# Patient Record
Sex: Female | Born: 1970 | Race: Black or African American | Hispanic: No
Health system: Southern US, Community
[De-identification: ages and names within clinical notes are randomized; demographics above are authoritative.]

## PROBLEM LIST (undated history)

## (undated) DIAGNOSIS — I10 Essential (primary) hypertension: Secondary | ICD-10-CM

---

## 2017-04-27 ENCOUNTER — Emergency Department (HOSPITAL_COMMUNITY): Payer: Self-pay

## 2017-04-27 ENCOUNTER — Emergency Department (HOSPITAL_COMMUNITY)
Admission: EM | Admit: 2017-04-27 | Discharge: 2017-04-27 | Disposition: A | Discharge status: Home / Self Care | Payer: Self-pay | Attending: Emergency Medicine | Admitting: Emergency Medicine

## 2017-04-27 ENCOUNTER — Encounter (HOSPITAL_COMMUNITY): Payer: Self-pay | Admitting: Vascular Surgery

## 2017-04-27 DIAGNOSIS — I10 Essential (primary) hypertension: Secondary | ICD-10-CM | POA: Insufficient documentation

## 2017-04-27 DIAGNOSIS — M79672 Pain in left foot: Secondary | ICD-10-CM | POA: Insufficient documentation

## 2017-04-27 HISTORY — DX: Essential (primary) hypertension: I10

## 2017-04-27 MED ORDER — DICLOFENAC SODIUM 50 MG PO TBEC: 50.0000 mg | DELAYED_RELEASE_TABLET | Freq: Two times a day (BID) | ORAL | 0 refills | Status: AC

## 2017-04-27 NOTE — ED Provider Notes (Signed)
[facility]-EMERGENCY DEPT Provider Note   CSN: [id number] Arrival date & time: 04/27/17  1710  By signing my name below, I, Orpah Cobb, attest that this documentation has been prepared under the direction and in the presence of [facility], NP-C. Electronically Signed: Orpah Cobb , ED Scribe. 04/27/17. 6:50 PM.    History   Chief Complaint Chief Complaint  Patient presents with  . Foot Pain    HPI Rachel Matthews is a 46 y.o. female who presents to the Emergency Department complaining of L foot pain with onset x2 days. Pt states that she has had worsening pain to the L heel for the past x2 days. Pt states that she suspects the shoes she wore x1 day ago are the cause of the pain. She denies the pain radiating up the leg. Pt states that the pain is exacerbated with ambulation. Pt has taken Tylenol with mild relief. She denies any other complaints.   The history is provided by the patient. No language interpreter was used.  Foot Pain  This is a new problem. The current episode started yesterday. The problem occurs constantly. The problem has been gradually worsening. The symptoms are aggravated by standing (bearing weight, ambulation). The symptoms are relieved by acetaminophen. She has tried acetaminophen for the symptoms. The treatment provided mild relief.    Past Medical History:  Diagnosis Date  . Hypertension     There are no active problems to display for this patient.   No past surgical history on file.  OB History    No data available       Home Medications    Prior to Admission medications   Medication Sig Start Date End Date Taking? Authorizing Provider  diclofenac (VOLTAREN) 50 MG EC tablet Take 1 tablet (50 mg total) by mouth 2 (two) times daily. 04/27/17   Janne Napoleon, NP    Family History No family history on file.  Social History Social History  Substance Use Topics  . Smoking status: Not on file  . Smokeless tobacco: Not on file  . Alcohol  use Not on file     Allergies   Patient has no allergy information on record.   Review of Systems Review of Systems  Constitutional: Negative for fever.  Musculoskeletal: Positive for arthralgias (L ankle/foot). Gait problem: due to pain.  Skin: Negative for color change and wound.     Physical Exam Updated Vital Signs BP (!) 148/90 (BP Location: Right Arm)   Pulse 89   Temp 98.2 F (36.8 C) (Oral)   Resp 17   SpO2 99%   Physical Exam  Constitutional: She is oriented to person, place, and time. She appears well-developed and well-nourished. No distress.  HENT:  Head: Normocephalic.  Eyes: EOM are normal.  Neck: Neck supple.  Cardiovascular: Normal rate.   Pulmonary/Chest: Effort normal.  Musculoskeletal: Normal range of motion.       Left ankle: She exhibits swelling. She exhibits no deformity, no laceration and normal pulse. Tenderness. Achilles tendon exhibits no pain and no defect.  Tenderness with palpation and range of motion of the medial aspect of the L ankle that extends to the medial aspect of the L foot. Increased pain with dorsiflexion and eversion. The achilles is non-tender, no defect palpated. There is a small amount of swelling to the L ankle medially.    Neurological: She is alert and oriented to person, place, and time. No cranial nerve deficit.  Skin: Skin is warm and dry.  Psychiatric: She has a normal mood and affect.  Nursing note and vitals reviewed.    ED Treatments / Results   DIAGNOSTIC STUDIES: Oxygen Saturation is 96% on RA, adequate by my interpretation.   COORDINATION OF CARE: 6:50 PM-Discussed next steps with pt. Pt verbalized understanding and is agreeable with the plan.    Labs (all labs ordered are listed, but only abnormal results are displayed) Labs Reviewed - No data to display   Radiology Dg Ankle Complete Left  Result Date: 04/27/2017 CLINICAL DATA:  Heel pain along the medial side onset 1 day. EXAM: LEFT ANKLE  COMPLETE - 3+ VIEW COMPARISON:  None. FINDINGS: There is no evidence of fracture, dislocation, or joint effusion. There is no evidence of arthropathy or other focal bone abnormality. There is mild soft tissue swelling over the lateral malleolus. The ankle mortise is maintained. Small calcification just posterior to the calcaneus may represent a tiny vascular phlebolith. IMPRESSION: No acute fracture nor dislocation of the left ankle. Mild soft tissue swelling over the lateral malleolus. Tiny soft tissue calcification posterior to the calcaneus may represent small vascular phlebolith. Electronically Signed   By: Tollie Ethavid  Kwon M.D.   On: 04/27/2017 18:17   Dg Foot Complete Left  Result Date: 04/27/2017 CLINICAL DATA:  Heel pain along the medial side x1 day EXAM: LEFT FOOT - COMPLETE 3+ VIEW COMPARISON:  None. FINDINGS: There is no evidence of fracture or dislocation. There is no evidence of arthropathy or other focal bone abnormality. Mild soft tissue swelling over the lateral malleolus and along the medial aspect of the midfoot. Probable tiny vascular phlebolith posterior to the calcaneus. Small bony exostosis of the anterolateral medial cuneiform may represent an accessory ossicle IMPRESSION: Mild soft tissue swelling along the medial midfoot and overlying the lateral malleolus. No acute displaced fracture, dislocation or bone destruction is noted. Electronically Signed   By: Tollie Ethavid  Kwon M.D.   On: 04/27/2017 18:19    Procedures Procedures (including critical care time)  Medications Ordered in ED Medications - No data to display   Initial Impression / Assessment and Plan / ED Course  I have reviewed the triage vital signs and the nursing notes.  Pertinent imaging results that were available during my care of the patient were reviewed by me and considered in my medical decision making (see chart for details).   Final Clinical Impressions(s) / ED Diagnoses  46 y.o. female with left foot pain stable  for d/c without fracture or dislocation noted on x-ray. Ace wrap applied, crutches, ice, elevation, NSAIDS and f/u with PCP or return here as needed.  Final diagnoses:  Foot pain, left    New Prescriptions New Prescriptions   DICLOFENAC (VOLTAREN) 50 MG EC TABLET    Take 1 tablet (50 mg total) by mouth 2 (two) times daily.  .I personally performed the services described in this documentation, which was scribed in my presence. The recorded information has been reviewed and is accurate.     Kerrie Buffaloeese, Taraann Olthoff EphraimM, [place]NP 04/27/17 1859    Raeford RazorKohut, Stephen, MD 04/27/17 1949

## 2017-04-27 NOTE — ED Triage Notes (Signed)
Pt reports to the ED for eval of left foot pain. Onset yesterday. She denies any known injury to her foot. Localizes the pain to the medial aspect of her left ankle. She has tried RICE with minimal relief in her symptoms. She also took extra strength Tylenol and denies significant decrease in her symptoms. She states that weight bearing increases the pain and compression helps the pain.  

## 2017-04-27 NOTE — ED Notes (Signed)
Talked to pt about seeing PCP about HPTN

## 2017-04-27 NOTE — ED Notes (Signed)
Pt verbalized understanding discharge instructions and denies any further needs or questions at this time. VS stable, ambulatory and steady gait.   

## 2018-01-10 IMAGING — DX DG ANKLE COMPLETE 3+V*L*
3 series · 3 of 3 positions shown · non-contrast
Comparison: None.

CLINICAL DATA: Heel pain along the medial side onset 1 day.

EXAM:
LEFT ANKLE COMPLETE - 3+ VIEW

[ankle ap]
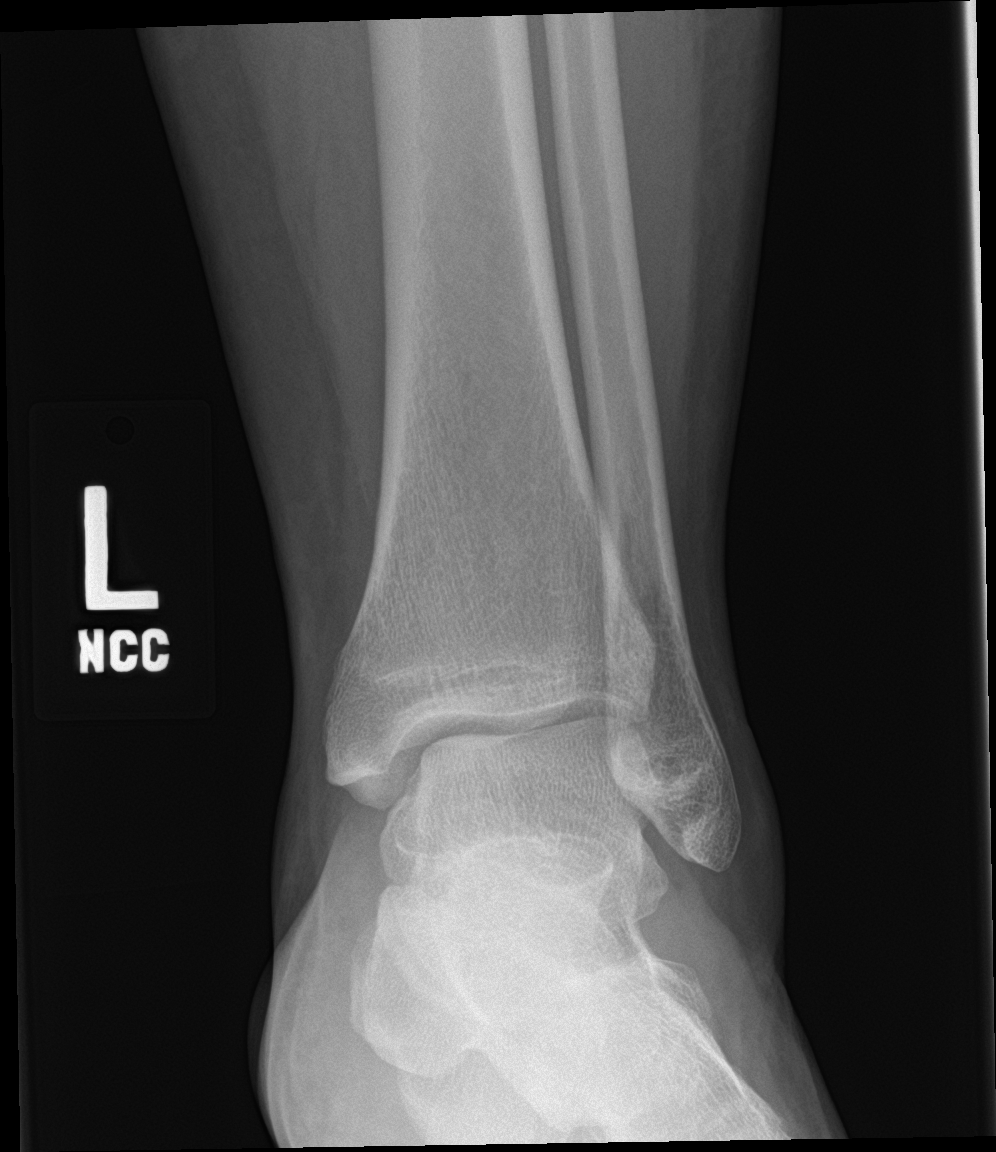

[ankle obl]
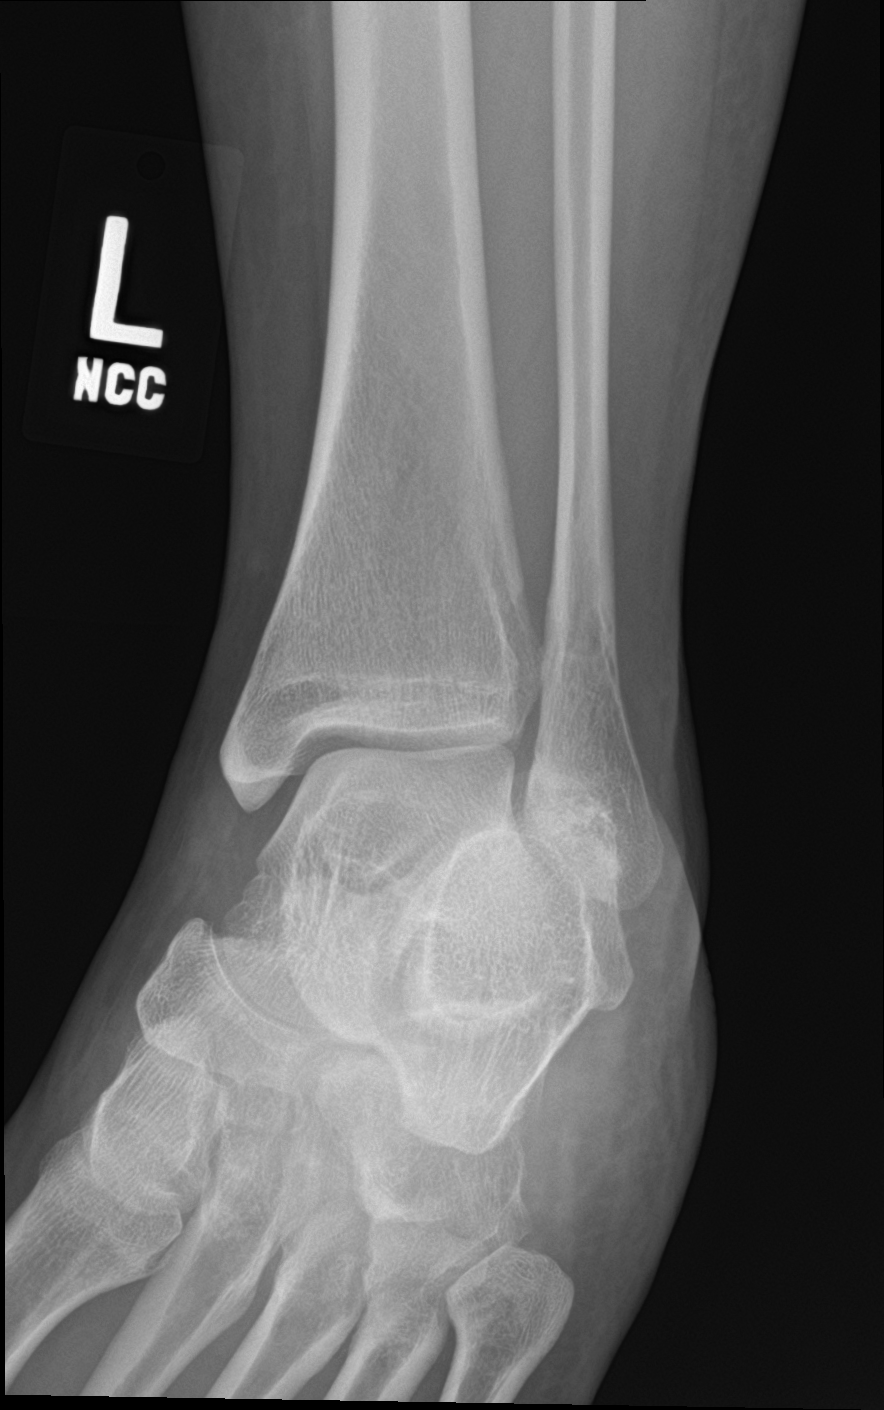

[ankle lat]
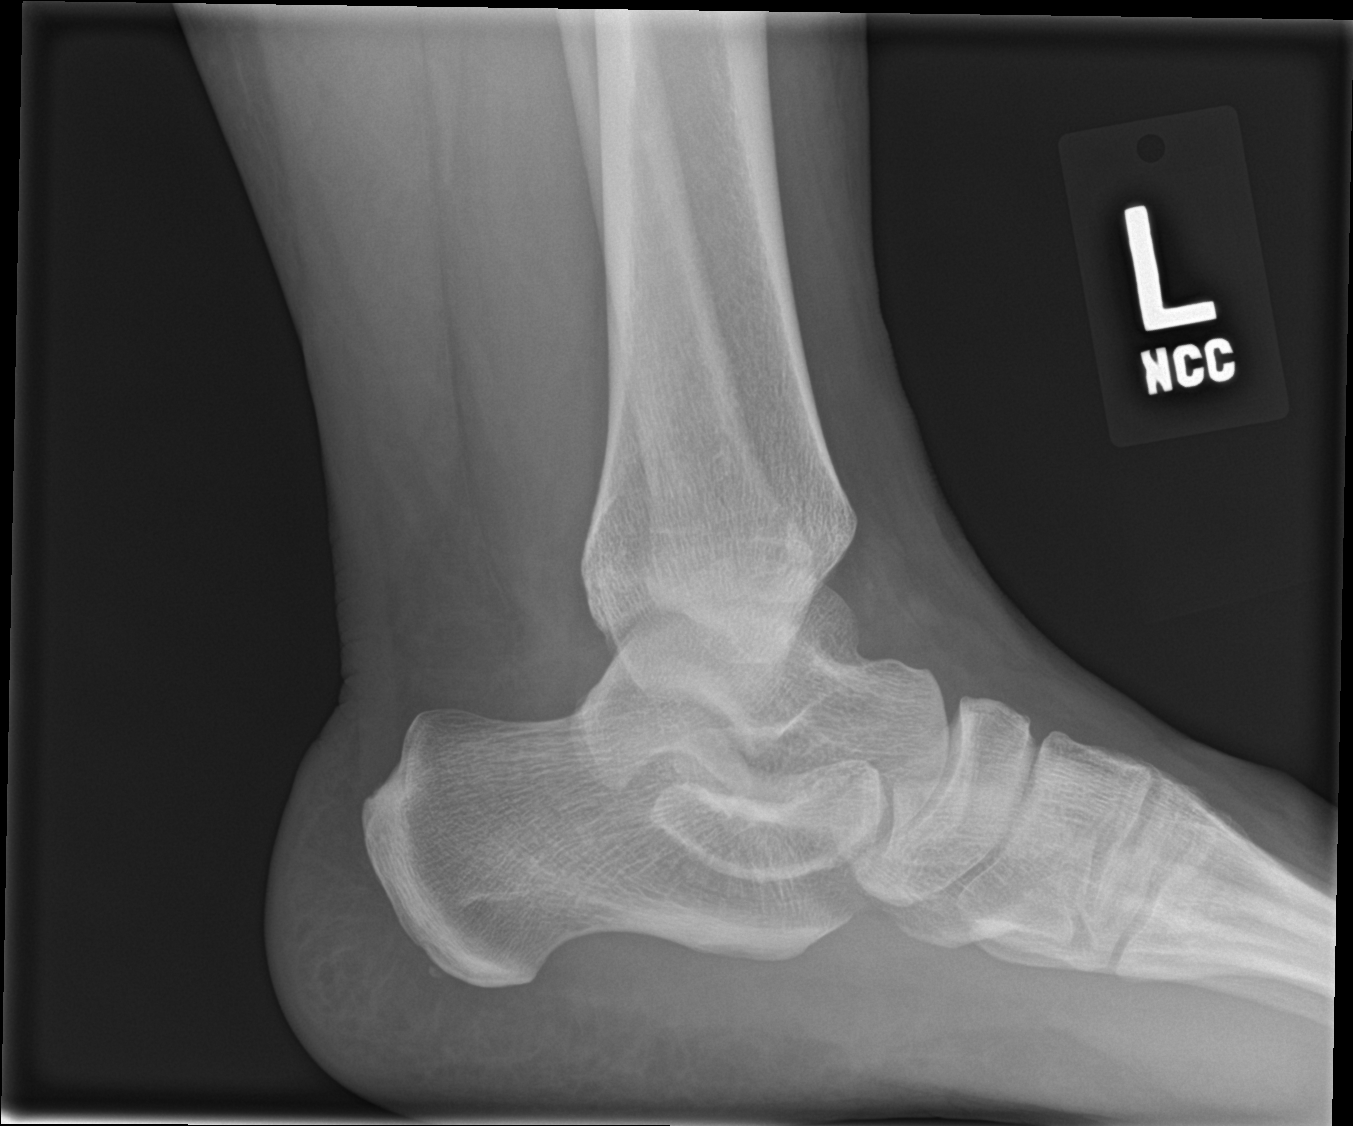

[3 of 3 positions shown; findings below may reference images not displayed]

FINDINGS: There is no evidence of fracture, dislocation, or joint effusion.
There is no evidence of arthropathy or other focal bone abnormality.
There is mild soft tissue swelling over the lateral malleolus. The
ankle mortise is maintained. Small calcification just posterior to
the calcaneus may represent a tiny vascular phlebolith.
IMPRESSION: No acute fracture nor dislocation of the left ankle. Mild soft
tissue swelling over the lateral malleolus. Tiny soft tissue
calcification posterior to the calcaneus may represent small
vascular phlebolith.

## 2018-12-22 ENCOUNTER — Other Ambulatory Visit: Payer: Self-pay | Admitting: Oncology
